# Patient Record
Sex: Male | Born: 1970 | Race: White | Hispanic: No | Marital: Married
Health system: Southern US, Community
[De-identification: ages and names within clinical notes are randomized; demographics above are authoritative.]

---

## 2016-11-03 DIAGNOSIS — Z0189 Encounter for other specified special examinations: Secondary | ICD-10-CM | POA: Diagnosis not present

## 2016-11-03 DIAGNOSIS — R972 Elevated prostate specific antigen [PSA]: Secondary | ICD-10-CM | POA: Diagnosis not present

## 2016-11-03 DIAGNOSIS — Z13220 Encounter for screening for lipoid disorders: Secondary | ICD-10-CM | POA: Diagnosis not present

## 2016-11-03 DIAGNOSIS — Z79899 Other long term (current) drug therapy: Secondary | ICD-10-CM | POA: Diagnosis not present

## 2017-06-13 ENCOUNTER — Other Ambulatory Visit: Payer: Self-pay | Admitting: Internal Medicine

## 2017-06-13 DIAGNOSIS — R1011 Right upper quadrant pain: Secondary | ICD-10-CM

## 2017-06-15 ENCOUNTER — Ambulatory Visit
Admission: RE | Admit: 2017-06-15 | Discharge: 2017-06-15 | Disposition: A | Discharge status: Home / Self Care | Payer: 59 | Source: Ambulatory Visit | Attending: Internal Medicine | Admitting: Internal Medicine

## 2017-06-15 DIAGNOSIS — R935 Abnormal findings on diagnostic imaging of other abdominal regions, including retroperitoneum: Secondary | ICD-10-CM | POA: Insufficient documentation

## 2017-06-15 DIAGNOSIS — R1011 Right upper quadrant pain: Secondary | ICD-10-CM | POA: Diagnosis not present

## 2018-03-22 DIAGNOSIS — Z125 Encounter for screening for malignant neoplasm of prostate: Secondary | ICD-10-CM | POA: Diagnosis not present

## 2018-03-22 DIAGNOSIS — Z13220 Encounter for screening for lipoid disorders: Secondary | ICD-10-CM | POA: Diagnosis not present

## 2018-03-22 DIAGNOSIS — Z79899 Other long term (current) drug therapy: Secondary | ICD-10-CM | POA: Diagnosis not present

## 2018-08-30 DIAGNOSIS — H5213 Myopia, bilateral: Secondary | ICD-10-CM | POA: Diagnosis not present

## 2019-01-30 DIAGNOSIS — Z125 Encounter for screening for malignant neoplasm of prostate: Secondary | ICD-10-CM | POA: Diagnosis not present

## 2019-01-30 DIAGNOSIS — Z13220 Encounter for screening for lipoid disorders: Secondary | ICD-10-CM | POA: Diagnosis not present

## 2019-01-30 DIAGNOSIS — Z79899 Other long term (current) drug therapy: Secondary | ICD-10-CM | POA: Diagnosis not present

## 2019-04-16 ENCOUNTER — Telehealth: Payer: Self-pay

## 2019-04-16 NOTE — Telephone Encounter (Signed)
[facility] IN CARE PER PATIENTS INSURANCE. NOT A PATIENT OF [facility].

## 2020-01-15 DIAGNOSIS — Z79899 Other long term (current) drug therapy: Secondary | ICD-10-CM | POA: Diagnosis not present

## 2020-01-15 DIAGNOSIS — Z125 Encounter for screening for malignant neoplasm of prostate: Secondary | ICD-10-CM | POA: Diagnosis not present

## 2020-01-15 DIAGNOSIS — Z13220 Encounter for screening for lipoid disorders: Secondary | ICD-10-CM | POA: Diagnosis not present

## 2020-05-29 DIAGNOSIS — Z20822 Contact with and (suspected) exposure to covid-19: Secondary | ICD-10-CM | POA: Diagnosis not present

## 2020-07-30 DIAGNOSIS — Z1211 Encounter for screening for malignant neoplasm of colon: Secondary | ICD-10-CM | POA: Diagnosis not present

## 2020-07-30 DIAGNOSIS — K5730 Diverticulosis of large intestine without perforation or abscess without bleeding: Secondary | ICD-10-CM | POA: Diagnosis not present

## 2020-11-18 DIAGNOSIS — H524 Presbyopia: Secondary | ICD-10-CM | POA: Diagnosis not present

## 2021-05-11 DIAGNOSIS — L309 Dermatitis, unspecified: Secondary | ICD-10-CM | POA: Diagnosis not present

## 2021-05-11 DIAGNOSIS — D2339 Other benign neoplasm of skin of other parts of face: Secondary | ICD-10-CM | POA: Diagnosis not present

## 2021-09-02 DIAGNOSIS — Z125 Encounter for screening for malignant neoplasm of prostate: Secondary | ICD-10-CM | POA: Diagnosis not present

## 2021-09-14 DIAGNOSIS — L03211 Cellulitis of face: Secondary | ICD-10-CM | POA: Diagnosis not present

## 2021-12-12 ENCOUNTER — Other Ambulatory Visit: Payer: Self-pay | Admitting: Internal Medicine

## 2021-12-12 DIAGNOSIS — R0600 Dyspnea, unspecified: Secondary | ICD-10-CM

## 2021-12-12 DIAGNOSIS — R0689 Other abnormalities of breathing: Secondary | ICD-10-CM

## 2021-12-16 ENCOUNTER — Ambulatory Visit
Admission: RE | Admit: 2021-12-16 | Discharge: 2021-12-16 | Disposition: A | Discharge status: Home / Self Care | Payer: 59 | Source: Ambulatory Visit | Attending: Internal Medicine | Admitting: Internal Medicine

## 2021-12-16 ENCOUNTER — Other Ambulatory Visit: Payer: Self-pay

## 2021-12-16 ENCOUNTER — Other Ambulatory Visit: Payer: 59

## 2021-12-16 DIAGNOSIS — I2510 Atherosclerotic heart disease of native coronary artery without angina pectoris: Secondary | ICD-10-CM | POA: Diagnosis not present

## 2021-12-16 DIAGNOSIS — R0600 Dyspnea, unspecified: Secondary | ICD-10-CM

## 2021-12-16 DIAGNOSIS — R0689 Other abnormalities of breathing: Secondary | ICD-10-CM

## 2021-12-16 DIAGNOSIS — I700 Atherosclerosis of aorta: Secondary | ICD-10-CM | POA: Diagnosis not present

## 2021-12-16 DIAGNOSIS — N62 Hypertrophy of breast: Secondary | ICD-10-CM | POA: Diagnosis not present

## 2021-12-16 MED ORDER — IOPAMIDOL (ISOVUE-300) INJECTION 61%
75.0000 mL | Freq: Once | INTRAVENOUS | Status: AC | PRN
  Administered 2021-12-16: 75 mL via INTRAVENOUS

## 2022-02-02 DIAGNOSIS — H9042 Sensorineural hearing loss, unilateral, left ear, with unrestricted hearing on the contralateral side: Secondary | ICD-10-CM | POA: Diagnosis not present

## 2022-02-02 DIAGNOSIS — R42 Dizziness and giddiness: Secondary | ICD-10-CM | POA: Diagnosis not present

## 2022-02-02 DIAGNOSIS — H8109 Meniere's disease, unspecified ear: Secondary | ICD-10-CM | POA: Diagnosis not present

## 2022-02-16 ENCOUNTER — Other Ambulatory Visit: Payer: Self-pay | Admitting: Unknown Physician Specialty

## 2022-02-16 DIAGNOSIS — H9122 Sudden idiopathic hearing loss, left ear: Secondary | ICD-10-CM | POA: Diagnosis not present

## 2022-02-16 DIAGNOSIS — H9042 Sensorineural hearing loss, unilateral, left ear, with unrestricted hearing on the contralateral side: Secondary | ICD-10-CM | POA: Diagnosis not present

## 2022-02-16 DIAGNOSIS — H8109 Meniere's disease, unspecified ear: Secondary | ICD-10-CM

## 2022-03-03 ENCOUNTER — Ambulatory Visit
Admission: RE | Admit: 2022-03-03 | Discharge: 2022-03-03 | Disposition: A | Discharge status: Home / Self Care | Payer: 59 | Source: Ambulatory Visit | Attending: Unknown Physician Specialty | Admitting: Unknown Physician Specialty

## 2022-03-03 DIAGNOSIS — H9192 Unspecified hearing loss, left ear: Secondary | ICD-10-CM | POA: Diagnosis not present

## 2022-03-03 DIAGNOSIS — H8109 Meniere's disease, unspecified ear: Secondary | ICD-10-CM

## 2022-03-03 MED ORDER — GADOBENATE DIMEGLUMINE 529 MG/ML IV SOLN
19.0000 mL | Freq: Once | INTRAVENOUS | Status: AC | PRN
  Administered 2022-03-03: 19 mL via INTRAVENOUS

## 2023-04-21 IMAGING — MR MR HEAD WO/W CM
12 of 13 series · 45 of 48 positions shown · IV contrast (multihance)
Comparison: None Available.

CLINICAL DATA: Active cochleovestibular Meniere's disease;
technologist note states left hearing loss

EXAM:
MRI HEAD WITHOUT AND WITH CONTRAST
TECHNIQUE: Multiplanar, multiecho pulse sequences of the brain and surrounding
structures were obtained without and with intravenous contrast.
CONTRAST:  19mL MULTIHANCE GADOBENATE DIMEGLUMINE 529 MG/ML IV SOLN

[Series 5: T1 · sagittal · 4.0mm · 0.72mm/px · 1 of 29 slices shown (1 of 3)]
[im 1/29]
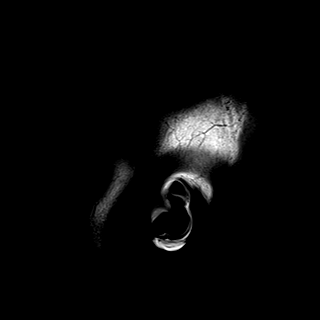

[Series 6: DWI · axial · 3.0mm · 0.94mm/px · z∈[-48,+121]mm · 13 of 191 slices shown]
[im 1/191]
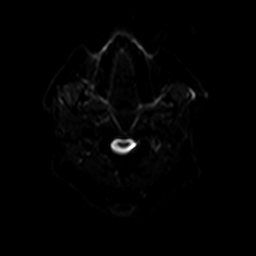
[im 16/191]
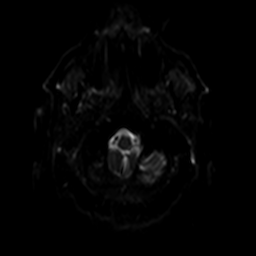
[im 32/191]
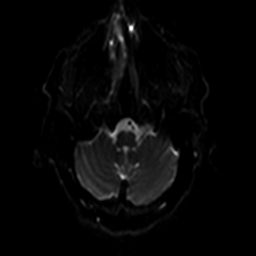
[im 48/191]
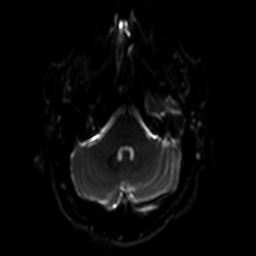
[im 64/191]
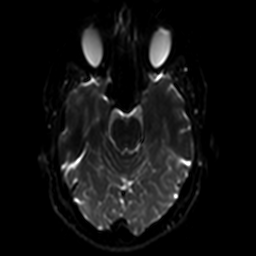
[im 80/191]
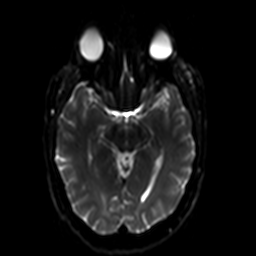
[im 96/191]
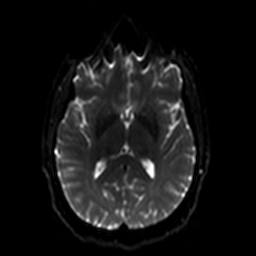
[im 111/191]
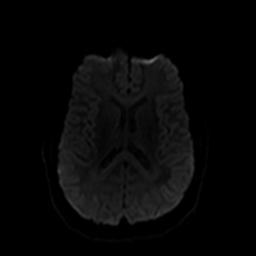
[im 127/191]
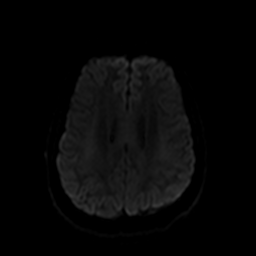
[im 143/191]
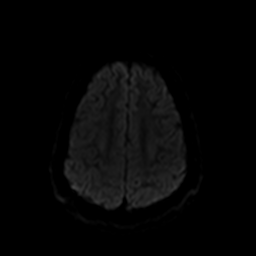
[im 159/191]
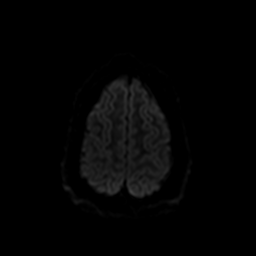
[im 175/191]
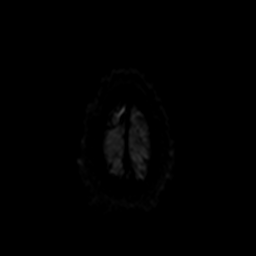
[im 191/191]
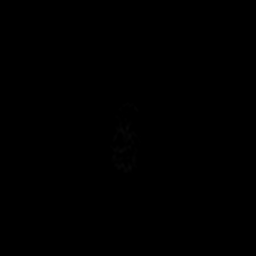

[Series 7: ax dwi_tracew · axial · 3.0mm · 0.94mm/px · z∈[-48,+121]mm · 6 of 95 slices shown]
[im 1/95]
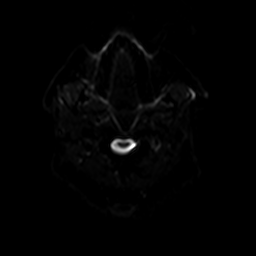
[im 19/95]
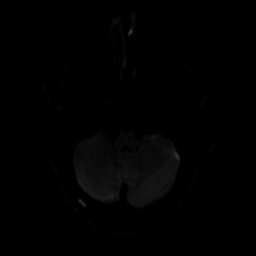
[im 38/95]
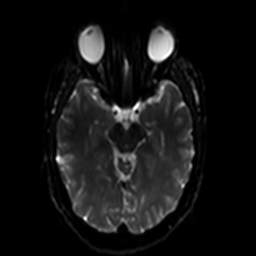
[im 57/95]
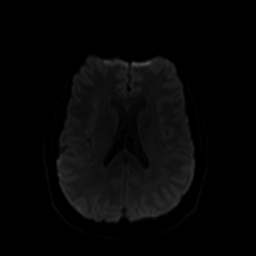
[im 76/95]
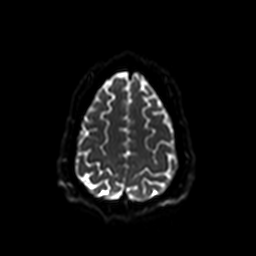
[im 95/95]
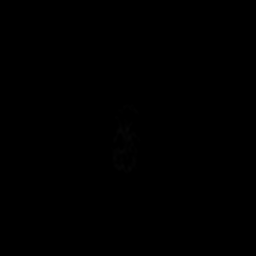

[Series 8: ax dwi_adc · axial · 3.0mm · 0.94mm/px · z∈[-48,+121]mm · 3 of 48 slices shown]
[im 1/48]
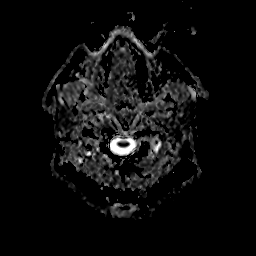
[im 24/48]
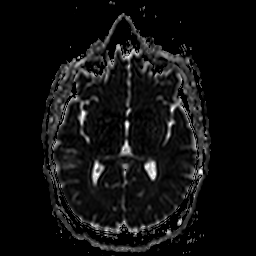
[im 48/48]
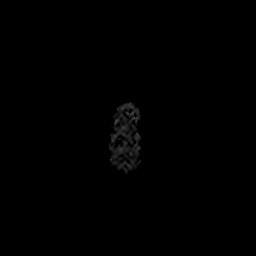

[Series 9: T2 · axial · 4.0mm · 0.36mm/px · z∈[-36,+133]mm · 2 of 34 slices shown]
[im 1/34]
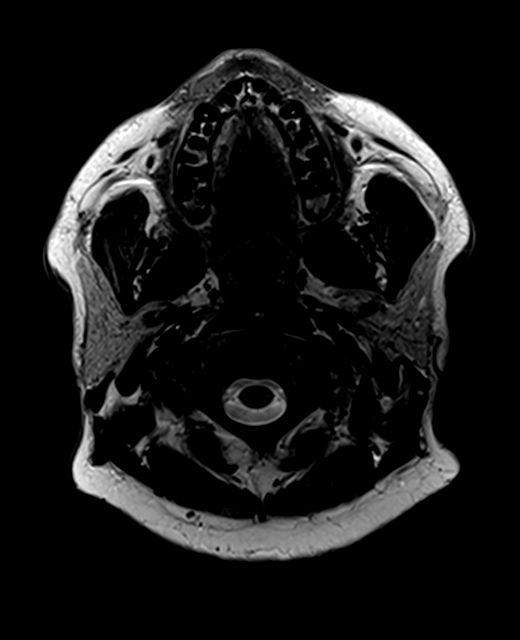
[im 34/34]
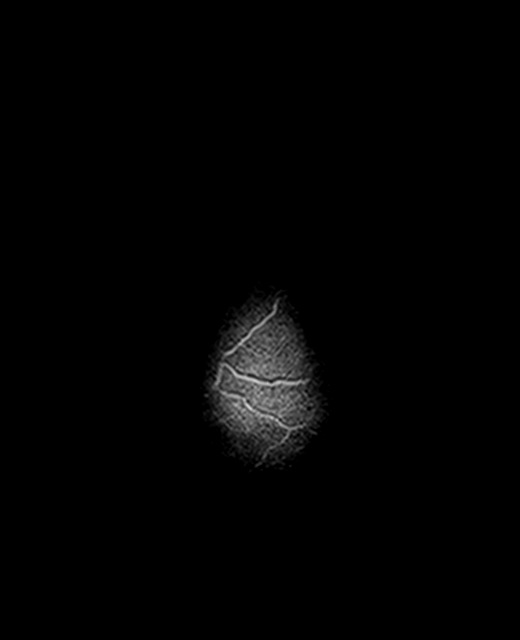

[Series 10: FLAIR · axial · 3.0mm · 0.72mm/px · z∈[-31,+128]mm · 2 of 28 slices shown]
[im 1/28]
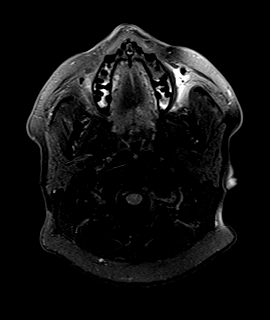
[im 28/28]
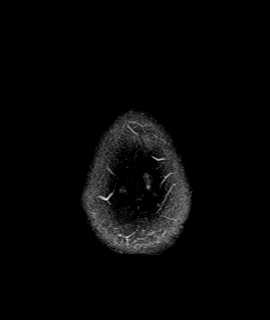

[Series 12: swi_images · axial · 3.0mm · 0.90mm/px · z∈[-33,+130]mm · 4 of 56 slices shown]
[im 1/56]
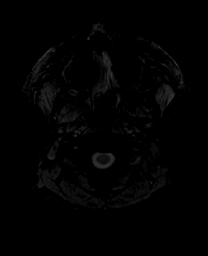
[im 19/56]
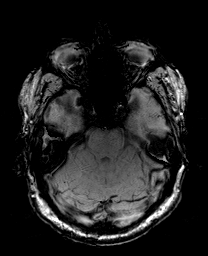
[im 37/56]
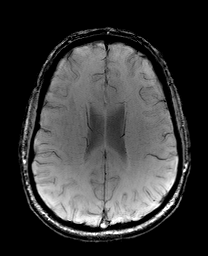
[im 56/56]
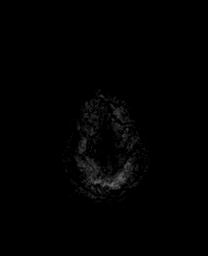

[Series 13: T1 · coronal · 3.0mm · 0.56mm/px · 1 of 13 slices shown (2 of 3)]
[im 1/13]
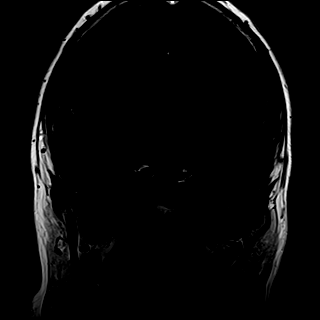

[Series 15: T1 · axial · 3.0mm · 0.50mm/px · 1 of 13 slices shown (3 of 3)]
[im 1/13]
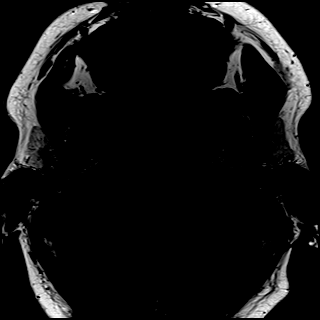

[Series 16: T1 post-contrast · coronal · 3.0mm · 0.56mm/px · 1 of 13 slices shown (1 of 3)]
[im 1/13]
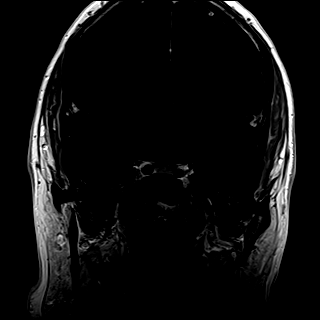

[Series 17: T1 post-contrast · axial · 3.0mm · 0.50mm/px · 1 of 13 slices shown (2 of 3)]
[im 1/13]
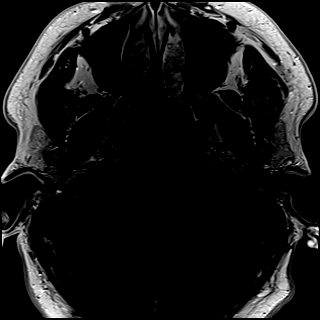

[Series 18: T1 post-contrast · axial · 1.0mm · 0.90mm/px · z∈[-29,+128]mm · 10 of 160 slices shown (3 of 3)]
[im 1/160]
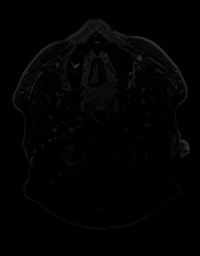
[im 18/160]
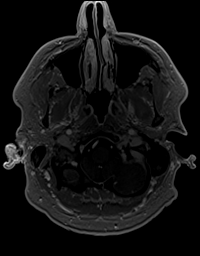
[im 36/160]
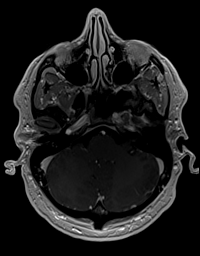
[im 54/160]
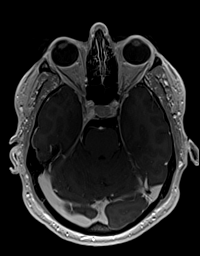
[im 71/160]
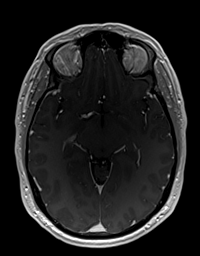
[im 89/160]
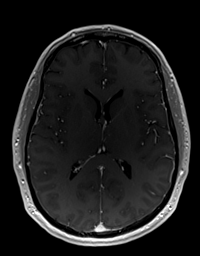
[im 107/160]
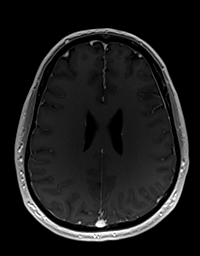
[im 124/160]
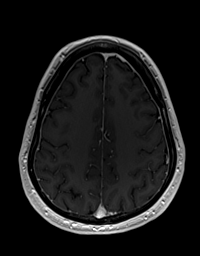
[im 142/160]
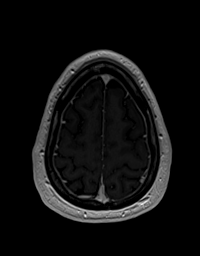
[im 160/160]
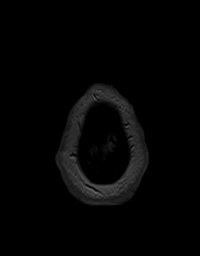

[45 of 48 positions shown; findings below may reference images not displayed]

FINDINGS: Brain: There is no cerebellopontine angle mass. Inner ear structures
demonstrate an unremarkable MR appearance. There is no abnormal
enhancement within the internal auditory canals.

No acute infarction or intracranial hemorrhage. There is no mass
effect or edema. There is no extra-axial fluid collection.
Ventricles and sulci are normal in size and configuration. No
abnormal enhancement.

Vascular: Major vessel flow voids at the skull base are preserved.

Skull and upper cervical spine: Normal marrow signal is preserved.

Sinuses/Orbits: Paranasal sinuses are clear. The orbits are
unremarkable.

Other: The sella is unremarkable.  Mastoid air cells are clear.
IMPRESSION: Normal MRI of the head.
